# Patient Record
Sex: Male | Born: 1995 | Race: Black or African American | Hispanic: No | Marital: Single | Smoking: Never smoker
Health system: Southern US, Community
[De-identification: ages and names within clinical notes are randomized; demographics above are authoritative.]

## PROBLEM LIST (undated history)

## (undated) DIAGNOSIS — Z973 Presence of spectacles and contact lenses: Secondary | ICD-10-CM

## (undated) HISTORY — PX: ABCESS DRAINAGE: SHX399

## (undated) HISTORY — PX: WISDOM TOOTH EXTRACTION: SHX21

## (undated) HISTORY — PX: NASAL SEPTOPLASTY W/ TURBINOPLASTY: SHX2070

---
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
## 2016-01-26 ENCOUNTER — Other Ambulatory Visit: Payer: Self-pay | Admitting: Obstetrics and Gynecology

## 2016-11-06 ENCOUNTER — Encounter: Payer: Self-pay | Admitting: *Deleted

## 2016-11-08 NOTE — Discharge Instructions (Signed)
[facility] °[facility] °ENDOSCOPIC SINUS SURGERY °[facility] ° °What is Functional Endoscopic Sinus Surgery? ° The Surgery involves making the natural openings of the sinuses larger by removing the bony partitions that separate the sinuses from the nasal cavity.  The natural sinus lining is preserved as much as possible to allow the sinuses to resume normal function after the surgery.  In some patients nasal polyps (excessively swollen lining of the sinuses) may be removed to relieve obstruction of the sinus openings.  The surgery is performed through the nose using lighted scopes, which eliminates the need for incisions on the face.  A septoplasty is a different procedure which is sometimes performed with sinus surgery.  It involves straightening the boy partition that separates the two sides of your nose.  A crooked or deviated septum may need repair if is obstructing the sinuses or nasal airflow.  Turbinate reduction is also often performed during sinus surgery.  The turbinates are bony proturberances from the side walls of the nose which swell and can obstruct the nose in patients with sinus and allergy problems.  Their size can be surgically reduced to help relieve nasal obstruction. ° °What Can Sinus Surgery Do For Me? ° Sinus surgery can reduce the frequency of sinus infections requiring antibiotic treatment.  This can provide improvement in nasal congestion, post-nasal drainage, facial pressure and nasal obstruction.  Surgery will NOT prevent you from ever having an infection again, so it usually only for patients who get infections 4 or more times yearly requiring antibiotics, or for infections that do not clear with antibiotics.  It will not cure nasal allergies, so patients with allergies may still require medication to treat their allergies after surgery. Surgery may improve headaches related to sinusitis, however, some people will continue to  require medication to control sinus headaches related to allergies.  Surgery will do nothing for other forms of headache (migraine, tension or cluster). ° °What Are the Risks of Endoscopic Sinus Surgery? ° Current techniques allow surgery to be performed safely with little risk, however, there are rare complications that patients should be aware of.  Because the sinuses are located around the eyes, there is risk of eye injury, including blindness, though again, this would be quite rare. This is usually a result of bleeding behind the eye during surgery, which puts the vision oat risk, though there are treatments to protect the vision and prevent permanent disrupted by surgery causing a leak of the spinal fluid that surrounds the brain.  More serious complications would include bleeding inside the brain cavity or damage to the brain.  Again, all of these complications are uncommon, and spinal fluid leaks can be safely managed surgically if they occur.  The most common complication of sinus surgery is bleeding from the nose, which may require packing or cauterization of the nose.  Continued sinus have polyps may experience recurrence of the polyps requiring revision surgery.  Alterations of sense of smell or injury to the tear ducts are also rare complications.  ° °What is the Surgery Like, and what is the Recovery? ° The Surgery usually takes a couple of hours to perform, and is usually performed under a general anesthetic (completely asleep).  Patients are usually discharged home after a couple of hours.  Sometimes during surgery it is necessary to pack the nose to control bleeding, and the packing is left in place for 24 - 48 hours, and removed by your surgeon.    If a septoplasty was performed during the procedure, there is often a splint placed which must be removed after 5-7 days.   °Discomfort: Pain is usually mild to moderate, and can be controlled by prescription pain medication or acetaminophen (Tylenol).   Aspirin, Ibuprofen (Advil, Motrin), or Naprosyn (Aleve) should be avoided, as they can cause increased bleeding.  Most patients feel sinus pressure like they have a bad head cold for several days.  Sleeping with your head elevated can help reduce swelling and facial pressure, as can ice packs over the face.  A humidifier may be helpful to keep the mucous and blood from drying in the nose.  ° °Diet: There are no specific diet restrictions, however, you should generally start with clear liquids and a light diet of bland foods because the anesthetic can cause some nausea.  Advance your diet depending on how your stomach feels.  Taking your pain medication with food will often help reduce stomach upset which pain medications can cause. ° °Nasal Saline Irrigation: It is important to remove blood clots and dried mucous from the nose as it is healing.  This is done by having you irrigate the nose at least 3 - 4 times daily with a salt water solution.  We recommend using NeilMed Sinus Rinse (available at the drug store).  Fill the squeeze bottle with the solution, bend over a sink, and insert the tip of the squeeze bottle into the nose ½ of an inch.  Point the tip of the squeeze bottle towards the inside corner of the eye on the same side your irrigating.  Squeeze the bottle and gently irrigate the nose.  If you bend forward as you do this, most of the fluid will flow back out of the nose, instead of down your throat.   The solution should be warm, near body temperature, when you irrigate.   Each time you irrigate, you should use a full squeeze bottle.  ° °Note that if you are instructed to use Nasal Steroid Sprays at any time after your surgery, irrigate with saline BEFORE using the steroid spray, so you do not wash it all out of the nose. °Another product, Nasal Saline Gel (such as AYR Nasal Saline Gel) can be applied in each nostril 3 - 4 times daily to moisture the nose and reduce scabbing or crusting. ° °Bleeding:   Bloody drainage from the nose can be expected for several days, and patients are instructed to irrigate their nose frequently with salt water to help remove mucous and blood clots.  The drainage may be dark red or brown, though some fresh blood may be seen intermittently, especially after irrigation.  Do not blow you nose, as bleeding may occur. If you must sneeze, keep your mouth open to allow air to escape through your mouth. ° °If heavy bleeding occurs: Irrigate the nose with saline to rinse out clots, then spray the nose 3 - 4 times with Afrin Nasal Decongestant Spray.  The spray will constrict the blood vessels to slow bleeding.  Pinch the lower half of your nose shut to apply pressure, and lay down with your head elevated.  Ice packs over the nose may help as well. If bleeding persists despite these measures, you should notify your doctor.  Do not use the Afrin routinely to control nasal congestion after surgery, as it can result in worsening congestion and may affect healing.  ° °Activity: Return to work varies among patients. Most patients will be out of   work at least 5 - 7 days to recover.  Patient may return to work after they are off of narcotic pain medication, and feeling well enough to perform the functions of their job.  Patients must avoid heavy lifting (over 10 pounds) or strenuous physical for 2 weeks after surgery, so your employer may need to assign you to light duty, or keep you out of work longer if light duty is not possible.  NOTE: you should not drive, operate dangerous machinery, do any mentally demanding tasks or make any important legal or financial decisions while on narcotic pain medication and recovering from the general anesthetic.  °  °Call Your Doctor Immediately if You Have Any of the Following: °1. Bleeding that you cannot control with the above measures °2. Loss of vision, double vision, bulging of the eye or black eyes. °3. Fever over 101 degrees °4. Neck stiffness with severe  headache, fever, nausea and change in mental state. °You are always encourage to call anytime with concerns, however, please call with requests for pain medication refills during office hours. ° °Office Endoscopy: During follow-up visits your doctor will remove any packing or splints that may have been placed and evaluate and clean your sinuses endoscopically.  Topical anesthetic will be used to make this as comfortable as possible, though you may want to take your pain medication prior to the visit.  How often this will need to be done varies from patient to patient.  After complete recovery from the surgery, you may need follow-up endoscopy from time to time, particularly if there is concern of recurrent infection or nasal polyps. ° ° °General Anesthesia, Adult, Care After °These instructions provide you with information about caring for yourself after your procedure. Your health care provider may also give you more specific instructions. Your treatment has been planned according to current medical practices, but problems sometimes occur. Call your health care provider if you have any problems or questions after your procedure. °What can I expect after the procedure? °After the procedure, it is common to have: °· Vomiting. °· A sore throat. °· Mental slowness. °It is common to feel: °· Nauseous. °· Cold or shivery. °· Sleepy. °· Tired. °· Sore or achy, even in parts of your body where you did not have surgery. °Follow these instructions at home: °For at least 24 hours after the procedure: °· Do not: °¨ Participate in activities where you could fall or become injured. °¨ Drive. °¨ Use heavy machinery. °¨ Drink alcohol. °¨ Take sleeping pills or medicines that cause drowsiness. °¨ Make important decisions or sign legal documents. °¨ Take care of children on your own. °· Rest. °Eating and drinking °· If you vomit, drink water, juice, or soup when you can drink without vomiting. °· Drink enough fluid to keep your  urine clear or pale yellow. °· Make sure you have little or no nausea before eating solid foods. °· Follow the diet recommended by your health care provider. °General instructions °· Have a responsible adult stay with you until you are awake and alert. °· Return to your normal activities as told by your health care provider. Ask your health care provider what activities are safe for you. °· Take over-the-counter and prescription medicines only as told by your health care provider. °· If you smoke, do not smoke without supervision. °· Keep all follow-up visits as told by your health care provider. This is important. °Contact a health care provider if: °· You continue to have nausea   or vomiting at home, and medicines are not helpful. °· You cannot drink fluids or start eating again. °· You cannot urinate after 8-12 hours. °· You develop a skin rash. °· You have fever. °· You have increasing redness at the site of your procedure. °Get help right away if: °· You have difficulty breathing. °· You have chest pain. °· You have unexpected bleeding. °· You feel that you are having a life-threatening or urgent problem. °This information is not intended to replace advice given to you by your health care provider. Make sure you discuss any questions you have with your health care provider. °Document Released: 02/12/2001 Document Revised: 04/10/2016 Document Reviewed: 10/21/2015 °Elsevier Interactive Patient Education © 2017 [employer] ° °

## 2016-11-10 ENCOUNTER — Ambulatory Visit: Payer: Medicaid Other | Admitting: Anesthesiology

## 2016-11-10 ENCOUNTER — Encounter
Admission: RE | Disposition: A | Discharge status: Home / Self Care | Payer: Self-pay | Source: Ambulatory Visit | Attending: Unknown Physician Specialty

## 2016-11-10 ENCOUNTER — Ambulatory Visit
Admission: RE | Admit: 2016-11-10 | Discharge: 2016-11-10 | Disposition: A | Discharge status: Home / Self Care | Payer: Medicaid Other | Source: Ambulatory Visit | Attending: Unknown Physician Specialty | Admitting: Unknown Physician Specialty

## 2016-11-10 DIAGNOSIS — J3489 Other specified disorders of nose and nasal sinuses: Secondary | ICD-10-CM | POA: Diagnosis not present

## 2016-11-10 HISTORY — PX: NASAL TURBINATE REDUCTION: SHX2072

## 2016-11-10 HISTORY — DX: Presence of spectacles and contact lenses: Z97.3

## 2016-11-10 HISTORY — PX: SEPTOPLASTY: SHX2393

## 2016-11-10 SURGERY — SEPTOPLASTY, NOSE
Anesthesia: General | Wound class: Clean Contaminated

## 2016-11-10 MED ORDER — ROCURONIUM BROMIDE 100 MG/10ML IV SOLN
INTRAVENOUS | Status: DC | PRN
  Administered 2016-11-10: 20 mg via INTRAVENOUS

## 2016-11-10 MED ORDER — GLYCOPYRROLATE 0.2 MG/ML IJ SOLN
INTRAMUSCULAR | Status: DC | PRN
  Administered 2016-11-10: 0.1 mg via INTRAVENOUS

## 2016-11-10 MED ORDER — MIDAZOLAM HCL 5 MG/5ML IJ SOLN
INTRAMUSCULAR | Status: DC | PRN
  Administered 2016-11-10: 2 mg via INTRAVENOUS

## 2016-11-10 MED ORDER — OXYMETAZOLINE HCL 0.05 % NA SOLN
6.0000 | Freq: Once | NASAL | Status: AC
  Administered 2016-11-10: 6 via NASAL

## 2016-11-10 MED ORDER — PROPOFOL 10 MG/ML IV BOLUS
INTRAVENOUS | Status: DC | PRN
  Administered 2016-11-10: 160 mg via INTRAVENOUS
  Administered 2016-11-10: 40 mg via INTRAVENOUS

## 2016-11-10 MED ORDER — ACETAMINOPHEN 10 MG/ML IV SOLN
1000.0000 mg | Freq: Once | INTRAVENOUS | Status: AC
  Administered 2016-11-10: 1000 mg via INTRAVENOUS

## 2016-11-10 MED ORDER — DEXAMETHASONE SODIUM PHOSPHATE 4 MG/ML IJ SOLN
INTRAMUSCULAR | Status: DC | PRN
  Administered 2016-11-10: 10 mg via INTRAVENOUS

## 2016-11-10 MED ORDER — FENTANYL CITRATE (PF) 100 MCG/2ML IJ SOLN
INTRAMUSCULAR | Status: DC | PRN
  Administered 2016-11-10: 100 ug via INTRAVENOUS

## 2016-11-10 MED ORDER — SCOPOLAMINE 1 MG/3DAYS TD PT72
1.0000 | MEDICATED_PATCH | TRANSDERMAL | Status: DC
  Administered 2016-11-10: 1.5 mg via TRANSDERMAL

## 2016-11-10 MED ORDER — SULFAMETHOXAZOLE-TRIMETHOPRIM 800-160 MG PO TABS: 1.0000 | ORAL_TABLET | Freq: Two times a day (BID) | ORAL | 0 refills | Status: DC

## 2016-11-10 MED ORDER — OXYCODONE HCL 5 MG/5ML PO SOLN: 5.0000 mg | Freq: Once | ORAL | Status: AC | PRN

## 2016-11-10 MED ORDER — OXYCODONE-ACETAMINOPHEN 5-325 MG PO TABS: 1.0000 | ORAL_TABLET | ORAL | 0 refills | Status: DC | PRN

## 2016-11-10 MED ORDER — LIDOCAINE-EPINEPHRINE 2 %-1:100000 IJ SOLN
INTRAMUSCULAR | Status: DC | PRN
  Administered 2016-11-10: 12 mL via INTRADERMAL

## 2016-11-10 MED ORDER — ONDANSETRON HCL 4 MG/2ML IJ SOLN
INTRAMUSCULAR | Status: DC | PRN
  Administered 2016-11-10: 4 mg via INTRAVENOUS

## 2016-11-10 MED ORDER — OXYCODONE HCL 5 MG PO TABS
5.0000 mg | ORAL_TABLET | Freq: Once | ORAL | Status: AC | PRN
  Administered 2016-11-10: 5 mg via ORAL

## 2016-11-10 MED ORDER — LIDOCAINE HCL (CARDIAC) 20 MG/ML IV SOLN
INTRAVENOUS | Status: DC | PRN
  Administered 2016-11-10: 40 mg via INTRAVENOUS

## 2016-11-10 MED ORDER — LACTATED RINGERS IV SOLN
INTRAVENOUS | Status: DC
  Administered 2016-11-10 (×2): via INTRAVENOUS

## 2016-11-10 MED ORDER — PHENYLEPHRINE HCL 0.5 % NA SOLN
NASAL | Status: DC | PRN
  Administered 2016-11-10: 15 mL via TOPICAL

## 2016-11-10 SURGICAL SUPPLY — 31 items
BLADE SURG 15 STRL LF DISP TIS (BLADE) IMPLANT
BLADE SURG 15 STRL SS (BLADE)
COAG SUCT 10F 3.5MM HAND CTRL (MISCELLANEOUS) ×2 IMPLANT
DRAPE HEAD BAR (DRAPES) ×2 IMPLANT
DRESSING NASL FOAM PST OP SINU (MISCELLANEOUS) IMPLANT
DRSG NASAL FOAM POST OP SINU (MISCELLANEOUS)
GLOVE BIO SURGEON STRL SZ7.5 (GLOVE) ×4 IMPLANT
HANDLE YANKAUER SUCT BULB TIP (MISCELLANEOUS) ×2 IMPLANT
KIT ROOM TURNOVER OR (KITS) ×2 IMPLANT
NEEDLE HYPO 25GX1X1/2 BEV (NEEDLE) ×2 IMPLANT
NS IRRIG 500ML POUR BTL (IV SOLUTION) ×2 IMPLANT
PACK DRAPE NASAL/ENT (PACKS) ×2 IMPLANT
PAD GROUND ADULT SPLIT (MISCELLANEOUS) ×2 IMPLANT
SOL ANTI-FOG 6CC FOG-OUT (MISCELLANEOUS) ×1 IMPLANT
SOL FOG-OUT ANTI-FOG 6CC (MISCELLANEOUS) ×1
SPLINT NASAL SEPTAL BLV .25 LG (MISCELLANEOUS) IMPLANT
SPLINT NASAL SEPTAL BLV .50 ST (MISCELLANEOUS) ×2 IMPLANT
SPONGE NEURO XRAY DETECT 1X3 (DISPOSABLE) ×2 IMPLANT
STRAP BODY AND KNEE 60X3 (MISCELLANEOUS) ×2 IMPLANT
SUT CHROMIC 3-0 (SUTURE) ×1
SUT CHROMIC 3-0 KS 27XMFL CR (SUTURE) ×1
SUT CHROMIC 5-0 (SUTURE)
SUT CHROMIC 5-0 P2 18XMFL CR (SUTURE)
SUT ETHILON 3-0 KS 30 BLK (SUTURE) ×2 IMPLANT
SUT PLAIN GUT 4-0 (SUTURE) IMPLANT
SUTURE CHRMC 3-0 KS 27XMFL CR (SUTURE) ×1 IMPLANT
SUTURE CHRMC 5-0 P2 18XMF CR (SUTURE) IMPLANT
SYRINGE 10CC LL (SYRINGE) ×2 IMPLANT
TOWEL OR 17X26 4PK STRL BLUE (TOWEL DISPOSABLE) ×2 IMPLANT
WATER STERILE IRR 250ML POUR (IV SOLUTION) ×2 IMPLANT
WATER STERILE IRR 500ML POUR (IV SOLUTION) ×2 IMPLANT

## 2016-11-10 NOTE — Transfer of Care (Signed)
Immediate Anesthesia Transfer of Care Note  Patient: Vincent Edwards  Procedure(s) Performed: Procedure(s): SEPTOPLASTY (N/A) TURBINATE REDUCTION/SUBMUCOSAL RESECTION (N/A)  Patient Location: PACU  Anesthesia Type: General  Level of Consciousness: awake, alert  and patient cooperative  Airway and Oxygen Therapy: Patient Spontanous Breathing and Patient connected to supplemental oxygen  Post-op Assessment: Post-op Vital signs reviewed, Patient's Cardiovascular Status Stable, Respiratory Function Stable, Patent Airway and No signs of Nausea or vomiting  Post-op Vital Signs: Reviewed and stable  Complications: No apparent anesthesia complications

## 2016-11-10 NOTE — Anesthesia Postprocedure Evaluation (Signed)
Anesthesia Post Note  Patient: Vincent Edwards  Procedure(s) Performed: Procedure(s) (LRB): SEPTOPLASTY (N/A) TURBINATE REDUCTION/SUBMUCOSAL RESECTION (N/A)  Patient location during evaluation: PACU Anesthesia Type: General Level of consciousness: awake and alert Pain management: pain level controlled Vital Signs Assessment: post-procedure vital signs reviewed and stable Respiratory status: spontaneous breathing, nonlabored ventilation, respiratory function stable and patient connected to nasal cannula oxygen Cardiovascular status: blood pressure returned to baseline and stable Postop Assessment: no signs of nausea or vomiting Anesthetic complications: no    Adlyn Fife

## 2016-11-10 NOTE — H&P (Signed)
  H+P  Reviewed and will be scanned in later. No changes noted. 

## 2016-11-10 NOTE — Anesthesia Preprocedure Evaluation (Addendum)
Anesthesia Evaluation  Patient identified by MRN, date of birth, ID band  Reviewed: NPO status   History of Anesthesia Complications Negative for: history of anesthetic complications  Airway Mallampati: II  TM Distance: >3 FB Neck ROM: full    Dental no notable dental hx.    Pulmonary neg pulmonary ROS,    Pulmonary exam normal        Cardiovascular Exercise Tolerance: Good negative cardio ROS Normal cardiovascular exam     Neuro/Psych negative neurological ROS  negative psych ROS   GI/Hepatic negative GI ROS, Neg liver ROS,   Endo/Other  negative endocrine ROS  Renal/GU negative Renal ROS  negative genitourinary   Musculoskeletal Fractured pelvis age 20   Abdominal   Peds  Hematology negative hematology ROS (+)   Anesthesia Other Findings   Reproductive/Obstetrics                            Anesthesia Physical Anesthesia Plan  ASA: I  Anesthesia Plan: General   Post-op Pain Management:    Induction:   Airway Management Planned:   Additional Equipment:   Intra-op Plan:   Post-operative Plan:   Informed Consent: I have reviewed the patients History and Physical, chart, labs and discussed the procedure including the risks, benefits and alternatives for the proposed anesthesia with the patient or authorized representative who has indicated his/her understanding and acceptance.     Plan Discussed with: CRNA  Anesthesia Plan Comments:         Anesthesia Quick Evaluation

## 2016-11-10 NOTE — Op Note (Signed)
PREOPERATIVE DIAGNOSIS:  Chronic nasal obstruction.  POSTOPERATIVE DIAGNOSIS:  Chronic nasal obstruction.  SURGEON:  Davina Pokehapman T. Jacynda Brunke, M.D.  NAME OF PROCEDURE:  1. Nasal septoplasty. 2. Submucous resection of inferior turbinates.  OPERATIVE FINDINGS:  Severe nasal septal deformity, hypertrophy of the inferior turbinates.   DESCRIPTION OF THE PROCEDURE:  Vincent Edwards was identified in the holding area and taken to the operating room and placed in the supine position.  After general endotracheal anesthesia was induced, the table was turned 45 degrees and the patient was placed in a semi-Fowler position.  The nose was then topically anesthetized with Lidocaine, cotton pledgets were placed within each nostril. After approximately 5 minutes, this was removed at which time a local anesthetic of 1% Lidocaine 1:100,000 units of Epinephrine was used to inject the inferior turbinates in the nasal septum. A total of 12 ml was used. Examination of the nose showed a severe left nasal septal deformity and tremendous hypertrophied inferior turbinate.  Beginning on the right hand side a hemitransfixion incision was then created on the leading edge of the septum on the right.  A subperichondrial plane was elevated posteriorly on the left and taken back to the perpendicular plate of the ethmoid where subperiosteal plane was elevated posteriorly on the left. A large septal spur was identified on the left hand side impacting on the inferior turbinate.  An inferior rim of cartilage was removed anteriorly with care taken to leave an anterior strut to prevent nasal collapse. With this strut removed the perpendicular plate of the ethmoid was separated from the quadrangular cartilage. The large septal spur was removed.  The septum was then replaced in the midline. Reinspection through each nostril showed excellent reduction of the septal deformity. A left posterior inferior fenestration was then created to allow hematoma  drainage.  With the septoplasty completed, beginning on the left-hand side, a 15 blade was used to incise along the inferior edge of the inferior turbinate. A superior laterally based flap was then elevated. The underlying conchal bone of mucosa was excised using Knight scissors. The flap was then laid back over the turbinate stump and cauterized using suction cautery. In a similar fashion the submucous resection was performed on the right.  With the submucous resection completed bilaterally and no active bleeding, the hemitransfixion incision was then closed using two interrupted 3-0 chromic sutures.  Plastic nasal septal splints were placed within each nostril and affixed to the septum using a 3-0 nylon suture. Stammberger was then used beneath each inferior turbinate for hemostasis.    The patient tolerated the procedure well, was returned to anesthesia, extubated in the operating room, and taken to the recovery room in stable condition.    CULTURES:  None.  SPECIMENS:  None.  ESTIMATED BLOOD LOSS:  25 cc.  Aubriauna Riner T  11/10/2016  10:33 AM

## 2016-11-10 NOTE — Anesthesia Procedure Notes (Signed)
Procedure Name: Intubation Date/Time: 11/10/2016 9:52 AM Performed by: Jimmy PicketAMYOT, Rielly Corlett Pre-anesthesia Checklist: Patient identified, Emergency Drugs available, Suction available, Patient being monitored and Timeout performed Patient Re-evaluated:Patient Re-evaluated prior to inductionOxygen Delivery Method: Circle system utilized Preoxygenation: Pre-oxygenation with 100% oxygen Intubation Type: IV induction Ventilation: Mask ventilation without difficulty Laryngoscope Size: Miller and 3 Grade View: Grade I Tube type: Oral Rae Tube size: 7.5 mm Number of attempts: 1 Placement Confirmation: ETT inserted through vocal cords under direct vision,  positive ETCO2 and breath sounds checked- equal and bilateral Tube secured with: Tape Dental Injury: Teeth and Oropharynx as per pre-operative assessment

## 2016-11-11 ENCOUNTER — Encounter: Payer: Self-pay | Admitting: Family Medicine

## 2016-11-14 ENCOUNTER — Encounter: Payer: Self-pay | Admitting: Unknown Physician Specialty

## 2019-01-22 ENCOUNTER — Ambulatory Visit: Payer: Self-pay | Admitting: Family Medicine

## 2019-10-05 ENCOUNTER — Emergency Department (HOSPITAL_COMMUNITY)
Admission: EM | Admit: 2019-10-05 | Discharge: 2019-10-06 | Disposition: A | Discharge status: Home / Self Care | Payer: Managed Care, Other (non HMO) | Attending: Emergency Medicine | Admitting: Emergency Medicine

## 2019-10-05 ENCOUNTER — Other Ambulatory Visit: Payer: Self-pay

## 2019-10-05 ENCOUNTER — Encounter (HOSPITAL_COMMUNITY): Payer: Self-pay | Admitting: Emergency Medicine

## 2019-10-05 DIAGNOSIS — K859 Acute pancreatitis without necrosis or infection, unspecified: Secondary | ICD-10-CM | POA: Diagnosis not present

## 2019-10-05 DIAGNOSIS — Z20828 Contact with and (suspected) exposure to other viral communicable diseases: Secondary | ICD-10-CM | POA: Diagnosis not present

## 2019-10-05 DIAGNOSIS — Z20822 Contact with and (suspected) exposure to covid-19: Secondary | ICD-10-CM

## 2019-10-05 DIAGNOSIS — R1013 Epigastric pain: Secondary | ICD-10-CM | POA: Diagnosis present

## 2019-10-05 LAB — COMPREHENSIVE METABOLIC PANEL
ALT: 24 U/L (ref 0–44)
AST: 21 U/L (ref 15–41)
Albumin: 4.5 g/dL (ref 3.5–5.0)
Alkaline Phosphatase: 48 U/L (ref 38–126)
Anion gap: 11 (ref 5–15)
BUN: 21 mg/dL — ABNORMAL HIGH (ref 6–20)
CO2: 24 mmol/L (ref 22–32)
Calcium: 9.5 mg/dL (ref 8.9–10.3)
Chloride: 104 mmol/L (ref 98–111)
Creatinine, Ser: 1.17 mg/dL (ref 0.61–1.24)
GFR calc Af Amer: >60 mL/min (ref >60)
GFR calc non Af Amer: >60 mL/min (ref >60)
Glucose, Bld: 117 mg/dL — ABNORMAL HIGH (ref 70–99)
Potassium: 4.4 mmol/L (ref 3.5–5.1)
Sodium: 139 mmol/L (ref 135–145)
Total Bilirubin: 1 mg/dL (ref 0.3–1.2)
Total Protein: 8 g/dL (ref 6.5–8.1)

## 2019-10-05 LAB — URINALYSIS, ROUTINE W REFLEX MICROSCOPIC
Bilirubin Urine: NEGATIVE
Glucose, UA: NEGATIVE mg/dL
Hgb urine dipstick: NEGATIVE
Ketones, ur: NEGATIVE mg/dL
Leukocytes,Ua: NEGATIVE
Nitrite: NEGATIVE
Protein, ur: NEGATIVE mg/dL
Specific Gravity, Urine: 1.03 (ref 1.005–1.030)
pH: 6 (ref 5.0–8.0)

## 2019-10-05 LAB — CBC
HCT: 48.5 % (ref 39.0–52.0)
Hemoglobin: 15.9 g/dL (ref 13.0–17.0)
MCH: 30.8 pg (ref 26.0–34.0)
MCHC: 32.8 g/dL (ref 30.0–36.0)
MCV: 94 fL (ref 80.0–100.0)
Platelets: 186 10*3/uL (ref 150–400)
RBC: 5.16 MIL/uL (ref 4.22–5.81)
RDW: 11.9 % (ref 11.5–15.5)
WBC: 4.6 10*3/uL (ref 4.0–10.5)
nRBC: 0 % (ref 0.0–0.2)

## 2019-10-05 LAB — LIPASE, BLOOD: Lipase: 135 U/L — ABNORMAL HIGH (ref 11–51)

## 2019-10-05 MED ORDER — SODIUM CHLORIDE 0.9% FLUSH: 3.0000 mL | Freq: Once | INTRAVENOUS | Status: DC

## 2019-10-05 NOTE — ED Triage Notes (Signed)
Patient states that there was a Covid 19 exposure at work two days ago.  He now is having abdominal pain.  He denies any vomiting, no fever, no shortness of breath, no chest pain, no loss of taste or smell, no sore throat.  Patient does have diarrhea with his pain.

## 2019-10-05 NOTE — ED Provider Notes (Signed)
[facility] EMERGENCY DEPARTMENT Provider Note   CSN: [id number] Arrival date & time: 10/05/19  1902     History   Chief Complaint Chief Complaint  Patient presents with  . Abdominal Pain    HPI Vincent Edwards is a 23 y.o. male with no significant past medical history who presents today for evaluation of abdominal pain.  He reports that for approximately 1 week he has had mid and upper abdominal pain in the middle of his upper abdomen.  He reports that it is made worse with eating thick bread.  He states that it feels like it shoots across his upper abdomen.   He reports that when this first started he thought he may be constipated so he took a dose of magnesium citrate and has had multiple episodes of diarrhea since.  He reports that he occasionally gets nauseous however denies any vomiting.  He does report that he possibly had a COVID-19 exposure at work 2 days ago however his symptoms had all started prior to his exposure.  He denies any shortness of breath, cough, chest pain.  No loss of sense of taste or smell.  No fevers or sore throat.  He denies any alcohol use.     HPI  Past Medical History:  Diagnosis Date  . Wears contact lenses     Patient Active Problem List   Diagnosis Date Noted  . Acromioclavicular sprain 09/25/2014  . Enthesopathy of hip 01/07/2013  . Disorder of pelvis 12/03/2012  . Closed avulsion fracture of anterior superior iliac spine of pelvis ([place]) 12/03/2012  . Arthralgia of hip 12/03/2012  . CONCUSSION 09/02/2010  . PES PLANUS 08/17/2010    Past Surgical History:  Procedure Laterality Date  . ABCESS DRAINAGE     nasal  . HIP SURGERY Bilateral 2014   Football injury  . NASAL SEPTOPLASTY W/ TURBINOPLASTY  12/207   chronic congestion - [place]  . NASAL TURBINATE REDUCTION N/A 11/10/2016   Procedure: TURBINATE REDUCTION/SUBMUCOSAL RESECTION;  Surgeon: Beverly Gust, MD;  Location: [facility];  Service: ENT;   Laterality: N/A;  . SEPTOPLASTY N/A 11/10/2016   Procedure: SEPTOPLASTY;  Surgeon: Beverly Gust, MD;  Location: [facility];  Service: ENT;  Laterality: N/A;  . WISDOM TOOTH EXTRACTION          Home Medications    Prior to Admission medications   Medication Sig Start Date End Date Taking? Authorizing Provider  oxyCODONE-acetaminophen (ROXICET) 5-325 MG tablet Take 1-2 tablets by mouth every 4 (four) hours as needed for severe pain. Patient not taking: Reported on 10/06/2019 11/10/16   Beverly Gust, MD  sulfamethoxazole-trimethoprim (BACTRIM DS,SEPTRA DS) 800-160 MG tablet Take 1 tablet by mouth 2 (two) times daily. Patient not taking: Reported on 10/06/2019 11/10/16   Beverly Gust, MD    Family History Family History  Problem Relation Age of Onset  . Diabetes Maternal Grandmother     Social History Social History   Tobacco Use  . Smoking status: Never Smoker  . Smokeless tobacco: Never Used  Substance Use Topics  . Alcohol use: No    Alcohol/week: 0.0 standard drinks  . Drug use: No     Allergies   Patient has no known allergies.   Review of Systems Review of Systems  Constitutional: Negative for chills and fever.  HENT: Negative for congestion.   Eyes: Negative for visual disturbance.  Respiratory: Negative for cough and shortness of breath.   Cardiovascular: Negative for chest pain.  Gastrointestinal:  Positive for abdominal pain, diarrhea and nausea. Negative for vomiting.  Genitourinary: Negative for dysuria and urgency.  Musculoskeletal: Negative for back pain and neck pain.  Skin: Negative for color change, rash and wound.  Neurological: Negative for weakness and headaches.  Psychiatric/Behavioral: Negative for confusion.  All other systems reviewed and are negative.    Physical Exam Updated Vital Signs BP 130/78 (BP Location: Right Arm)   Pulse (!) 58   Temp 97.9 F (36.6 C) (Oral)   Resp 18   SpO2 97%   Physical Exam  Vitals signs and nursing note reviewed.  Constitutional:      General: He is not in acute distress.    Appearance: He is well-developed. He is not diaphoretic.  HENT:     Head: Normocephalic and atraumatic.  Eyes:     General: No scleral icterus.       Right eye: No discharge.        Left eye: No discharge.     Conjunctiva/sclera: Conjunctivae normal.  Neck:     Musculoskeletal: Normal range of motion.  Cardiovascular:     Rate and Rhythm: Regular rhythm. Bradycardia present.     Heart sounds: Normal heart sounds.  Pulmonary:     Effort: Pulmonary effort is normal. No respiratory distress.     Breath sounds: No stridor.  Abdominal:     General: Abdomen is flat. Bowel sounds are increased. There is no distension.     Palpations: Abdomen is soft.     Tenderness: There is abdominal tenderness in the right upper quadrant and epigastric area.     Hernia: No hernia is present.  Musculoskeletal:        General: No deformity.  Skin:    General: Skin is warm and dry.  Neurological:     Mental Status: He is alert.     Motor: No abnormal muscle tone.     Comments: Able to provide history with out difficulty.   Psychiatric:        Mood and Affect: Mood normal.        Behavior: Behavior normal.      ED Treatments / Results  Labs (all labs ordered are listed, but only abnormal results are displayed) Labs Reviewed  LIPASE, BLOOD - Abnormal; Notable for the following components:      Result Value   Lipase 135 (*)    All other components within normal limits  COMPREHENSIVE METABOLIC PANEL - Abnormal; Notable for the following components:   Glucose, Bld 117 (*)    BUN 21 (*)    All other components within normal limits  SARS CORONAVIRUS 2 (TAT 6-24 HRS)  CBC  URINALYSIS, ROUTINE W REFLEX MICROSCOPIC    EKG None  Radiology No results found.  Procedures Procedures (including critical care time)  Medications Ordered in ED Medications  sodium chloride flush (NS) 0.9 %  injection 3 mL (has no administration in time range)     Initial Impression / Assessment and Plan / ED Course  I have reviewed the triage vital signs and the nursing notes.  Pertinent labs & imaging results that were available during my care of the patient were reviewed by me and considered in my medical decision making (see chart for details).       Patient presents today for evaluation of upper abdominal pain for approximately 1 week ago.  On exam he has mild tenderness to palpation in the epigastric and right upper quadrant.  He does note diarrhea, however  I suspect this is secondary to the magnesium citrate that he took.  He reports mild nausea without vomiting.  On exam he is generally well-appearing, afebrile and not tachycardic.  He appears slightly hemoconcentrated as his creatinine is at the upper end of normal at 1.17, recommended increase p.o. fluids.  CBC and CMP otherwise without significant hematologic or electrolyte derangements.  UA does show specific gravity 1.030 consistent with mild dehydration.  Lipase is elevated at 135.  He denies any alcohol use.  He had a CT scan in 2012 which did not note any significant congenital abnormalities.   Given that his symptoms have been going on for over a week if he had a significant obstructive pathology I would expect transaminitis or an elevated alk phos however these are normal.  His pain is mild and intermittent.  He was able to p.o. challenge in the emergency room without difficulty.  We discussed clear liquid diet for 3 days followed by pancreas diet and the importance of abstaining from alcohol.  We discussed specific return precautions.  He was offered prescriptions for both nausea medicine and pain medicine both of which he refused.  Based on the apparent mild nature of his symptoms, given his refusal of nausea and pain medicine, do not suspect imaging would change anticipated treatment and disposition.  He did have a Covid exposure at  work and reports that he needs a Covid test before he can return to work.  His diarrhea, nausea and other symptoms were present before the exposure therefore I do not suspect they are related, however Covid test was ordered.  He denies any cough, chest pain or shortness of breath.    This patient was discussed with Dr. Roxanne Mins.   Return precautions were discussed with patient who states their understanding.  At the time of discharge patient denied any unaddressed complaints or concerns.  Patient is agreeable for discharge home.   Final Clinical Impressions(s) / ED Diagnoses   Final diagnoses:  Acute pancreatitis, unspecified complication status, unspecified pancreatitis type  Exposure to COVID-19 virus    ED Discharge Orders    None       Ollen Gross 73/71/06 2694    Delora Fuel, MD 85/46/27 [phone]

## 2019-10-06 LAB — SARS CORONAVIRUS 2 (TAT 6-24 HRS): SARS Coronavirus 2: NEGATIVE

## 2019-10-06 NOTE — Discharge Instructions (Addendum)
Please follow a clear liquid diet for the next 3 days.  After that you may slowly start to resume solid foods.  Please follow the listed pancreatitis eating plan.  Please make sure you are drinking plenty of fluids.  Please follow up with primary care doctor.

## 2019-10-14 ENCOUNTER — Other Ambulatory Visit: Payer: Self-pay

## 2019-10-14 ENCOUNTER — Encounter (HOSPITAL_COMMUNITY): Payer: Self-pay | Admitting: Emergency Medicine

## 2019-10-14 ENCOUNTER — Emergency Department (HOSPITAL_COMMUNITY)
Admission: EM | Admit: 2019-10-14 | Discharge: 2019-10-14 | Disposition: A | Discharge status: Home / Self Care | Payer: Managed Care, Other (non HMO) | Attending: Emergency Medicine | Admitting: Emergency Medicine

## 2019-10-14 ENCOUNTER — Emergency Department (HOSPITAL_COMMUNITY): Payer: Managed Care, Other (non HMO)

## 2019-10-14 DIAGNOSIS — R110 Nausea: Secondary | ICD-10-CM

## 2019-10-14 DIAGNOSIS — R1084 Generalized abdominal pain: Secondary | ICD-10-CM | POA: Insufficient documentation

## 2019-10-14 DIAGNOSIS — R1031 Right lower quadrant pain: Secondary | ICD-10-CM | POA: Diagnosis present

## 2019-10-14 LAB — COMPREHENSIVE METABOLIC PANEL
ALT: 29 U/L (ref 0–44)
AST: 28 U/L (ref 15–41)
Albumin: 4.5 g/dL (ref 3.5–5.0)
Alkaline Phosphatase: 48 U/L (ref 38–126)
Anion gap: 9 (ref 5–15)
BUN: 22 mg/dL — ABNORMAL HIGH (ref 6–20)
CO2: 28 mmol/L (ref 22–32)
Calcium: 9.5 mg/dL (ref 8.9–10.3)
Chloride: 102 mmol/L (ref 98–111)
Creatinine, Ser: 1.52 mg/dL — ABNORMAL HIGH (ref 0.61–1.24)
GFR calc Af Amer: >60 mL/min (ref >60)
GFR calc non Af Amer: >60 mL/min (ref >60)
Glucose, Bld: 119 mg/dL — ABNORMAL HIGH (ref 70–99)
Potassium: 4.8 mmol/L (ref 3.5–5.1)
Sodium: 139 mmol/L (ref 135–145)
Total Bilirubin: 1.1 mg/dL (ref 0.3–1.2)
Total Protein: 7.5 g/dL (ref 6.5–8.1)

## 2019-10-14 LAB — URINALYSIS, ROUTINE W REFLEX MICROSCOPIC
Bilirubin Urine: NEGATIVE
Glucose, UA: NEGATIVE mg/dL
Hgb urine dipstick: NEGATIVE
Ketones, ur: NEGATIVE mg/dL
Leukocytes,Ua: NEGATIVE
Nitrite: NEGATIVE
Protein, ur: NEGATIVE mg/dL
Specific Gravity, Urine: 1.028 (ref 1.005–1.030)
pH: 6 (ref 5.0–8.0)

## 2019-10-14 LAB — CBC
HCT: 44.4 % (ref 39.0–52.0)
Hemoglobin: 15.2 g/dL (ref 13.0–17.0)
MCH: 30.9 pg (ref 26.0–34.0)
MCHC: 34.2 g/dL (ref 30.0–36.0)
MCV: 90.2 fL (ref 80.0–100.0)
Platelets: 202 10*3/uL (ref 150–400)
RBC: 4.92 MIL/uL (ref 4.22–5.81)
RDW: 11.7 % (ref 11.5–15.5)
WBC: 6 10*3/uL (ref 4.0–10.5)
nRBC: 0 % (ref 0.0–0.2)

## 2019-10-14 LAB — LIPASE, BLOOD: Lipase: 26 U/L (ref 11–51)

## 2019-10-14 MED ORDER — IOHEXOL 300 MG/ML  SOLN
100.0000 mL | Freq: Once | INTRAMUSCULAR | Status: AC | PRN
  Administered 2019-10-14: 100 mL via INTRAVENOUS

## 2019-10-14 MED ORDER — ONDANSETRON 4 MG PO TBDP: 4.0000 mg | ORAL_TABLET | Freq: Three times a day (TID) | ORAL | 0 refills | Status: DC | PRN

## 2019-10-14 MED ORDER — FAMOTIDINE 20 MG PO TABS: 20.0000 mg | ORAL_TABLET | Freq: Two times a day (BID) | ORAL | 0 refills | Status: DC

## 2019-10-14 MED ORDER — ONDANSETRON HCL 4 MG/2ML IJ SOLN
4.0000 mg | Freq: Once | INTRAMUSCULAR | Status: AC
Start: 2019-10-14 — End: 2019-10-14
  Administered 2019-10-14: 4 mg via INTRAVENOUS
  Filled 2019-10-14: qty 2

## 2019-10-14 MED ORDER — FAMOTIDINE 20 MG PO TABS
40.0000 mg | ORAL_TABLET | Freq: Once | ORAL | Status: AC
  Administered 2019-10-14: 40 mg via ORAL
  Filled 2019-10-14: qty 2

## 2019-10-14 MED ORDER — LACTATED RINGERS IV BOLUS
1000.0000 mL | Freq: Once | INTRAVENOUS | Status: AC
  Administered 2019-10-14: 09:00:00 1000 mL via INTRAVENOUS

## 2019-10-14 MED ORDER — SODIUM CHLORIDE 0.9% FLUSH
3.0000 mL | Freq: Once | INTRAVENOUS | Status: AC
  Administered 2019-10-14: 3 mL via INTRAVENOUS

## 2019-10-14 NOTE — ED Notes (Signed)
Pt ambulated to RR and back to room. Pt ambulated well

## 2019-10-14 NOTE — ED Provider Notes (Signed)
[facility] EMERGENCY DEPARTMENT Provider Note   CSN: [id number] Arrival date & time: 10/14/19  0327     History   Chief Complaint Chief Complaint  Patient presents with   Abdominal Pain    HPI Vincent Edwards is a 23 y.o. male.     23yo M who p/w abd pain and nausea. Pt presented to ED on 11/16 for abd pain and nausea.  At that time, his lipase was mildly elevated and he was diagnosed with pancreatitis, instructed to follow clear liquid diet until symptoms improved.  He reports that he has been following instructions but his abdominal pain has persisted and his nausea has worsened.  Occasionally his pain is in in the right lower quadrant and becomes severe, radiating to his back, and then suddenly releases and he feels better momentarily.  He denies any associated urinary symptoms.  He has not had any vomiting or diarrhea.  No fevers or URI symptoms.  He denies any alcohol use.  No heavy NSAID or Tylenol use.  His pain is worse when he sneezes.  The history is provided by the patient.  Abdominal Pain   Past Medical History:  Diagnosis Date   Wears contact lenses     Patient Active Problem List   Diagnosis Date Noted   Acromioclavicular sprain 09/25/2014   Enthesopathy of hip 01/07/2013   Disorder of pelvis 12/03/2012   Closed avulsion fracture of anterior superior iliac spine of pelvis ([place]) 12/03/2012   Arthralgia of hip 12/03/2012   CONCUSSION 09/02/2010   PES PLANUS 08/17/2010    Past Surgical History:  Procedure Laterality Date   ABCESS DRAINAGE     nasal   HIP SURGERY Bilateral 2014   Football injury   NASAL SEPTOPLASTY W/ TURBINOPLASTY  12/207   chronic congestion - [place]   NASAL TURBINATE REDUCTION N/A 11/10/2016   Procedure: TURBINATE REDUCTION/SUBMUCOSAL RESECTION;  Surgeon: Beverly Gust, MD;  Location: [facility];  Service: ENT;  Laterality: N/A;   SEPTOPLASTY N/A 11/10/2016   Procedure: Valarie Merino;  Surgeon:  Beverly Gust, MD;  Location: [facility];  Service: ENT;  Laterality: N/A;   [place] Medications    Prior to Admission medications   Medication Sig Start Date End Date Taking? Authorizing Provider  famotidine (PEPCID) 20 MG tablet Take 1 tablet (20 mg total) by mouth 2 (two) times daily. 10/14/19   Ariyannah Pauling, Wenda Overland, MD  ondansetron (ZOFRAN ODT) 4 MG disintegrating tablet Take 1 tablet (4 mg total) by mouth every 8 (eight) hours as needed for nausea or vomiting. 10/14/19   Toshiro Hanken, Wenda Overland, MD  oxyCODONE-acetaminophen (ROXICET) 5-325 MG tablet Take 1-2 tablets by mouth every 4 (four) hours as needed for severe pain. Patient not taking: Reported on 10/06/2019 11/10/16   Beverly Gust, MD  sulfamethoxazole-trimethoprim (BACTRIM DS,SEPTRA DS) 800-160 MG tablet Take 1 tablet by mouth 2 (two) times daily. Patient not taking: Reported on 10/06/2019 11/10/16   Beverly Gust, MD    Family History Family History  Problem Relation Age of Onset   Diabetes Maternal Grandmother     Social History Social History   Tobacco Use   Smoking status: Never Smoker   Smokeless tobacco: Never Used  Substance Use Topics   Alcohol use: No    Alcohol/week: 0.0 standard drinks   Drug use: No     Allergies   Patient has no known allergies.   Review of Systems  Review of Systems  Gastrointestinal: Positive for abdominal pain.   All other systems reviewed and are negative except that which was mentioned in HPI   Physical Exam Updated Vital Signs BP 124/66    Pulse (!) 39    Temp 98.3 F (36.8 C) (Oral)    Resp 14    Ht 6' (1.829 m)    Wt 80 kg    SpO2 93%    BMI 23.92 kg/m   Physical Exam Vitals signs and nursing note reviewed.  Constitutional:      General: He is not in acute distress.    Appearance: He is well-developed.  HENT:     Head: Normocephalic and atraumatic.  Eyes:     Comments: B/l conjunctival injection  Neck:       Musculoskeletal: Neck supple.  Cardiovascular:     Rate and Rhythm: Regular rhythm. Bradycardia present.     Heart sounds: Normal heart sounds. No murmur.  Pulmonary:     Effort: Pulmonary effort is normal.     Breath sounds: Normal breath sounds.  Abdominal:     General: Bowel sounds are normal. There is no distension.     Palpations: Abdomen is soft.     Tenderness: There is abdominal tenderness in the right lower quadrant and left upper quadrant. There is no guarding or rebound.     Comments: Generalized TTP but worst in LUQ, RLQ  Skin:    General: Skin is warm and dry.  Neurological:     Mental Status: He is alert and oriented to person, place, and time.     Comments: Fluent speech  Psychiatric:        Mood and Affect: Mood normal.        Judgment: Judgment normal.      ED Treatments / Results  Labs (all labs ordered are listed, but only abnormal results are displayed) Labs Reviewed  COMPREHENSIVE METABOLIC PANEL - Abnormal; Notable for the following components:      Result Value   Glucose, Bld 119 (*)    BUN 22 (*)    Creatinine, Ser 1.52 (*)    All other components within normal limits  LIPASE, BLOOD  CBC  URINALYSIS, ROUTINE W REFLEX MICROSCOPIC    EKG None  Radiology Ct Abdomen Pelvis W Contrast  Result Date: 10/14/2019 CLINICAL DATA:  23 year old male with history of upper abdominal pain extending around the back for the past 2 weeks with nausea. EXAM: CT ABDOMEN AND PELVIS WITH CONTRAST TECHNIQUE: Multidetector CT imaging of the abdomen and pelvis was performed using the standard protocol following bolus administration of intravenous contrast. CONTRAST:  100mL OMNIPAQUE IOHEXOL 300 MG/ML  SOLN COMPARISON:  CT the abdomen and pelvis 11/09/2011. FINDINGS: Lower chest: Unremarkable. Hepatobiliary: No suspicious cystic or solid hepatic lesions. No intra or extrahepatic biliary ductal dilatation. Gallbladder is normal in appearance. Pancreas: No pancreatic  mass. No pancreatic ductal dilatation. No pancreatic or peripancreatic fluid collections or inflammatory changes. Spleen: Unremarkable. Adrenals/Urinary Tract: Bilateral kidneys and bilateral adrenal glands are normal in appearance. No hydroureteronephrosis. Urinary bladder is normal in appearance. Stomach/Bowel: Normal appearance of the stomach. No pathologic dilatation of small bowel or colon. Normal appendix. Vascular/Lymphatic: No significant atherosclerotic disease, aneurysm or dissection noted in the abdominal or pelvic vasculature. No lymphadenopathy noted in the abdomen or pelvis. Reproductive: Prostate gland and seminal vesicles are unremarkable in appearance. Other: No significant volume of ascites.  No pneumoperitoneum. Musculoskeletal: Bilateral pars defects at L5 incidentally noted. There are no  aggressive appearing lytic or blastic lesions noted in the visualized portions of the skeleton. IMPRESSION: 1. No acute findings are noted in the abdomen or pelvis to account for the patient's symptoms. 2. Normal appendix. 3. 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 Electronically Signed   By: Trudie Reed M.D.   On: 10/14/2019 09:35    Procedures Procedures (including critical care time)  Medications Ordered in ED Medications  sodium chloride flush (NS) 0.9 % injection 3 mL (3 mLs Intravenous Given 10/14/19 0835)  lactated ringers bolus 1,000 mL (0 mLs Intravenous Stopped 10/14/19 1006)  ondansetron (ZOFRAN) injection 4 mg (4 mg Intravenous Given 10/14/19 0834)  iohexol (OMNIPAQUE) 300 MG/ML solution 100 mL (100 mLs Intravenous Contrast Given 10/14/19 0928)  famotidine (PEPCID) tablet 40 mg (40 mg Oral Given 10/14/19 1007)     Initial Impression / Assessment and Plan / ED Course  I have reviewed the triage vital signs and the nursing notes.  Pertinent labs & imaging results that were available during my care of the patient were reviewed by me and  considered in my medical decision making (see chart for details).        Patient was nontoxic on exam, bradycardic but he is athletic.  Generalized abdominal tenderness but worst in right lower quadrant and left upper quadrant.  I consider the possibility of kidney stone given the unilateral nature of his pain and radiation to his back, however he has had no urinary symptoms and UA is normal.  His lab work is notable for normalization of his lipase however mild AKI with creatinine of 1.5.  Differential includes ruptured appendicitis, ongoing pancreatitis, gallbladder pathology, enteritis.  Because of the persistent nature of his symptoms without improvement and his tenderness on exam, obtain CT of abdomen pelvis which was unremarkable.  On reassessment after receiving above medications, the patient reported his nausea was improved and he had been able to tolerate water.  Discussed work-up findings and discussed the possibility of gastritis or PUD given the chronicity of his symptoms and normal CT.  Recommended trial of H2 blocker with transition to PPI in 1 to 2 weeks if no relief.  Counseled on low acid diet and good hydration.  Strongly recommended establishing care with PCP for follow-up.  I have extensively reviewed return precautions that he has voiced understanding.  Final Clinical Impressions(s) / ED Diagnoses   Final diagnoses:  Generalized abdominal pain  Nausea    ED Discharge Orders         Ordered    ondansetron (ZOFRAN ODT) 4 MG disintegrating tablet  Every 8 hours PRN     10/14/19 1126    famotidine (PEPCID) 20 MG tablet  2 times daily     10/14/19 1126           Shaniqua Guillot, Ambrose Finland, MD 10/14/19 1139

## 2019-10-14 NOTE — Discharge Instructions (Signed)
Start taking pepcid 1 pill twice daily. Take zofran as needed for nausea. Avoid foods high in acid--tomatoes, citrus fruits/juices, or spicy/greasy foods. If no improvement after taking pepcid daily for 1 week, switch from pepcid to PREVACID, PRILOSEC, or NEXIUM once daily for 14 days.

## 2019-10-14 NOTE — ED Triage Notes (Signed)
Pt c/o R sided abd pain onset 2 weeks ago, constant nausea, pt states he was seen and told to try liquid diet, nausea now worse. Pt states tonight at work, pain was mainly on R side, after lifting object he felt something "let go" then pain radiated to back.  Pt reports pain now coming in waves radiating around abdomen.

## 2019-10-25 ENCOUNTER — Ambulatory Visit (HOSPITAL_COMMUNITY)
Admission: EM | Admit: 2019-10-25 | Discharge: 2019-10-25 | Disposition: A | Discharge status: Home / Self Care | Payer: Managed Care, Other (non HMO) | Attending: Emergency Medicine | Admitting: Emergency Medicine

## 2019-10-25 ENCOUNTER — Other Ambulatory Visit: Payer: Self-pay

## 2019-10-25 ENCOUNTER — Encounter (HOSPITAL_COMMUNITY): Payer: Self-pay | Admitting: Emergency Medicine

## 2019-10-25 DIAGNOSIS — Z20822 Contact with and (suspected) exposure to covid-19: Secondary | ICD-10-CM

## 2019-10-25 DIAGNOSIS — Z20828 Contact with and (suspected) exposure to other viral communicable diseases: Secondary | ICD-10-CM | POA: Diagnosis not present

## 2019-10-25 NOTE — Discharge Instructions (Addendum)

## 2019-10-25 NOTE — ED Triage Notes (Signed)
Patient was at a party 3-4 days ago.  A cousin tested positive.  Patient has no symptoms.  Patients immediate family has tested positive, patient does not live with them.

## 2019-10-25 NOTE — ED Provider Notes (Signed)
[facility]    CSN: [id number] Arrival date & time: 10/25/19  1144      History   Chief Complaint Chief Complaint  Patient presents with  . Labs Only    HPI Vincent Edwards is a 23 y.o. male.   Vincent Edwards [age] y old male presented to the urgent care with a complaint of COVID esposure 3 days ago. Her couisin tested positive for COVID-19 recently.  Denies sick exposure to flu or strep.  Denies recent travel.  Denies aggravating or alleviating symptoms.  Denies previous COVID infection.   Denies fever, chills, fatigue, nasal congestion, rhinorrhea, sore throat, cough, SOB, wheezing, chest pain, nausea, vomiting, changes in bowel or bladder habits.   The history is provided by the patient. No language interpreter was used.    Past Medical History:  Diagnosis Date  . Wears contact lenses     Patient Active Problem List   Diagnosis Date Noted  . Acromioclavicular sprain 09/25/2014  . Enthesopathy of hip 01/07/2013  . Disorder of pelvis 12/03/2012  . Closed avulsion fracture of anterior superior iliac spine of pelvis (HCC) 12/03/2012  . Arthralgia of hip 12/03/2012  . CONCUSSION 09/02/2010  . PES PLANUS 08/17/2010    Past Surgical History:  Procedure Laterality Date  . ABCESS DRAINAGE     nasal  . HIP SURGERY Bilateral 2014   Football injury  . NASAL SEPTOPLASTY W/ TURBINOPLASTY  12/207   chronic congestion - McQueen  . NASAL TURBINATE REDUCTION N/A 11/10/2016   Procedure: TURBINATE REDUCTION/SUBMUCOSAL RESECTION;  Surgeon: Linus Salmonshapman McQueen, MD;  [facility];  Service: ENT;  Laterality: N/A;  . SEPTOPLASTY N/A 11/10/2016   Procedure: SEPTOPLASTY;  Surgeon: Linus Salmonshapman McQueen, MD;  [facility];  Service: ENT;  Laterality: N/A;  . WISDOM TOOTH EXTRACTION         Home Medications    Prior to Admission medications   Medication Sig Start Date End Date Taking? Authorizing Provider  famotidine (PEPCID) 20 MG tablet Take 1 tablet  (20 mg total) by mouth 2 (two) times daily. 10/14/19   Little, Ambrose Finlandachel Morgan, MD  ondansetron (ZOFRAN ODT) 4 MG disintegrating tablet Take 1 tablet (4 mg total) by mouth every 8 (eight) hours as needed for nausea or vomiting. 10/14/19   Little, Ambrose Finlandachel Morgan, MD  oxyCODONE-acetaminophen (ROXICET) 5-325 MG tablet Take 1-2 tablets by mouth every 4 (four) hours as needed for severe pain. Patient not taking: Reported on 10/06/2019 11/10/16   Linus SalmonsMcQueen, Chapman, MD  sulfamethoxazole-trimethoprim (BACTRIM DS,SEPTRA DS) 800-160 MG tablet Take 1 tablet by mouth 2 (two) times daily. Patient not taking: Reported on 10/06/2019 11/10/16   Linus SalmonsMcQueen, Chapman, MD    Family History Family History  Problem Relation Age of Onset  . Diabetes Maternal Grandmother     Social History Social History   Tobacco Use  . Smoking status: Never Smoker  . Smokeless tobacco: Never Used  Substance Use Topics  . Alcohol use: No    Alcohol/week: 0.0 standard drinks  . Drug use: No     Allergies   Patient has no known allergies.   Review of Systems Review of Systems  Constitutional: Negative.   HENT: Negative.   Respiratory: Negative.   Cardiovascular: Negative.   Neurological: Negative.   ROS: All other are negative   Physical Exam Triage Vital Signs ED Triage Vitals  Enc Vitals Group     BP      Pulse      Resp  Temp      Temp src      SpO2      Weight      Height      Head Circumference      Peak Flow      Pain Score      Pain Loc      Pain Edu?      Excl. in GC?    No data found.  Updated Vital Signs There were no vitals taken for this visit.  Visual Acuity Right Eye Distance:   Left Eye Distance:   Bilateral Distance:    Right Eye Near:   Left Eye Near:    Bilateral Near:     Physical Exam Vitals signs and nursing note reviewed.  Constitutional:      General: He is not in acute distress.    Appearance: Normal appearance. He is normal weight. He is not ill-appearing.   HENT:     Head: Normocephalic.     Right Ear: Tympanic membrane, ear canal and external ear normal. There is no impacted cerumen.     Left Ear: Tympanic membrane, ear canal and external ear normal. There is no impacted cerumen.     Nose: Nose normal. No congestion.  Cardiovascular:     Rate and Rhythm: Regular rhythm.     Pulses: Normal pulses.     Heart sounds: Normal heart sounds.  Pulmonary:     Effort: Pulmonary effort is normal. No respiratory distress.     Breath sounds: Normal breath sounds.  Chest:     Chest wall: No tenderness.  Abdominal:     General: Abdomen is flat. Bowel sounds are normal.     Palpations: Abdomen is soft.  Neurological:     Mental Status: He is alert.      UC Treatments / Results  Labs (all labs ordered are listed, but only abnormal results are displayed) Labs Reviewed  NOVEL CORONAVIRUS, NAA (HOSP ORDER, SEND-OUT TO REF LAB; TAT 18-24 HRS)    EKG   Radiology No results found.  Procedures Procedures (including critical care time)  Medications Ordered in UC Medications - No data to display  Initial Impression / Assessment and Plan / UC Course  I have reviewed the triage vital signs and the nursing notes.  Pertinent labs & imaging results that were available during my care of the patient were reviewed by me and considered in my medical decision making (see chart for details).    Patient stable for discharge.  Benign physical exam.  Covid test ordered. Will call if results positive.  To return if symptoms get worse. Final Clinical Impressions(s) / UC Diagnoses   Final diagnoses:  Exposure to COVID-19 virus     Discharge Instructions     COVID testing ordered.  It will take between 2-7 days for test results.  Someone will contact you regarding abnormal results.    In the meantime: You should remain isolated in your home for 10 days from symptom onset AND greater than 72 hours after symptoms resolution (absence of fever without  the use of fever-reducing medication and improvement in respiratory symptoms), whichever is longer Get plenty of rest and push fluids Use medications daily for symptom relief Use OTC medications like ibuprofen or tylenol as needed fever or pain Call or go to the ED if you have any new or worsening symptoms such as fever, worsening cough, shortness of breath, chest tightness, chest pain, turning blue, changes in mental status, etc..Marland Kitchen  ED Prescriptions    None     PDMP not reviewed this encounter.   Emerson Monte, Tarnov 10/25/19 [phone]

## 2019-10-27 LAB — NOVEL CORONAVIRUS, NAA (HOSP ORDER, SEND-OUT TO REF LAB; TAT 18-24 HRS): SARS-CoV-2, NAA: NOT DETECTED

## 2020-02-11 ENCOUNTER — Ambulatory Visit (INDEPENDENT_AMBULATORY_CARE_PROVIDER_SITE_OTHER): Payer: Managed Care, Other (non HMO) | Admitting: Primary Care

## 2020-02-11 ENCOUNTER — Other Ambulatory Visit: Payer: Self-pay

## 2020-02-11 ENCOUNTER — Encounter: Payer: Self-pay | Admitting: Primary Care

## 2020-02-11 DIAGNOSIS — R1084 Generalized abdominal pain: Secondary | ICD-10-CM | POA: Diagnosis not present

## 2020-02-11 DIAGNOSIS — R109 Unspecified abdominal pain: Secondary | ICD-10-CM | POA: Insufficient documentation

## 2020-02-11 DIAGNOSIS — K219 Gastro-esophageal reflux disease without esophagitis: Secondary | ICD-10-CM | POA: Insufficient documentation

## 2020-02-11 LAB — COMPREHENSIVE METABOLIC PANEL
ALT: 24 U/L (ref 0–53)
AST: 22 U/L (ref 0–37)
Albumin: 4.6 g/dL (ref 3.5–5.2)
Alkaline Phosphatase: 55 U/L (ref 39–117)
BUN: 18 mg/dL (ref 6–23)
CO2: 30 mEq/L (ref 19–32)
Calcium: 9.6 mg/dL (ref 8.4–10.5)
Chloride: 102 mEq/L (ref 96–112)
Creatinine, Ser: 1.28 mg/dL (ref 0.40–1.50)
GFR: 84.16 mL/min (ref >60.00)
Glucose, Bld: 93 mg/dL (ref 70–99)
Potassium: 4.6 mEq/L (ref 3.5–5.1)
Sodium: 137 mEq/L (ref 135–145)
Total Bilirubin: 0.8 mg/dL (ref 0.2–1.2)
Total Protein: 7.7 g/dL (ref 6.0–8.3)

## 2020-02-11 LAB — CBC
HCT: 46.4 % (ref 39.0–52.0)
Hemoglobin: 15.3 g/dL (ref 13.0–17.0)
MCHC: 32.9 g/dL (ref 30.0–36.0)
MCV: 93.2 fl (ref 78.0–100.0)
Platelets: 194 10*3/uL (ref 150.0–400.0)
RBC: 4.98 Mil/uL (ref 4.22–5.81)
RDW: 12.4 % (ref 11.5–15.5)
WBC: 3.9 10*3/uL — ABNORMAL LOW (ref 4.0–10.5)

## 2020-02-11 LAB — LIPASE: Lipase: 8 U/L — ABNORMAL LOW (ref 11.0–59.0)

## 2020-02-11 MED ORDER — OMEPRAZOLE 20 MG PO CPDR: 20.0000 mg | DELAYED_RELEASE_CAPSULE | Freq: Every day | ORAL | 0 refills | Status: AC

## 2020-02-11 NOTE — Patient Instructions (Addendum)
Stop by the lab prior to leaving today. I will notify you of your results once received.   Stop famotidine.  Start omeprazole 20 mg once daily for heartburn. Take this everyday for one month.  Please update me via my chart in 2 weeks.  It was a pleasure to meet you today! Please don't hesitate to call or message me with any questions. Welcome to [facility]!

## 2020-02-11 NOTE — Progress Notes (Signed)
Subjective:    Patient ID: Vincent Edwards, male    DOB: 1996/01/23, 24 y.o.   MRN: [id number]  HPI  This visit occurred during the SARS-CoV-2 public health emergency.  Safety protocols were in place, including screening questions prior to the visit, additional usage of staff PPE, and extensive cleaning of exam room while observing appropriate contact time as indicated for disinfecting solutions.   Vincent Edwards is a 24 year old male who presents today to establish care and discuss the problems mentioned below. Will obtain/review records.  1) Abdominal Pain:   Evaluated at [facility] on 10/04/20 for complaints of one week history of mid and upper abdominal pain, worse with eating "thick bread". Labs grossly unremarkable including UA, Lipase of 135. He was discharged home later that day with instructions for abstaining from alcohol, clear liquid diet, Covid-19 test.  He returned to [facility] on 10/13/20 with complaints of RLQ abdominal pain with radiation to his back. Also with nausea without vomiting. Tender on exam to lower abdomen so CT of abdomen/pelvis was obtained. CT negative so it was recommended he try H2 blocker then PPI if no improvement.   Today he endorses chronic abdominal pain that begins to bilateral lower abdomen sides with radiation to bilateral upper abdomen sides and meet to the epigastric region. Symptoms began about one year ago, intermittent occurring 2-3 times weekly. Foods that he's noticed causing this pain include, grilled cheese sandwich, apple, chips.  He does feel an acidic feeling in the chest despite food choices. Most everything causes his burning with nausea in the chest. He's taken famotidine daily for a period of time which helped reduce nausea. He's now taking famotidine 2-3 times weekly with little improvement.   He denies diarrhea. He is having bowel movements daily but they are firm and he has to strain. He is drinking mostly water, endorses a healthy diet, exercises  regularly.   He works at [employer], lifts heavy boxes often. Last November he was walking in the freezer at work carrying boxes, had a "stabbing" sudden onset of epigastric pain. This prompted his ED visit.    BP Readings from Last 3 Encounters:  02/11/20 118/88  10/25/19 134/83  10/14/19 124/85     Review of Systems  Constitutional: Negative for fever.  Respiratory: Negative for shortness of breath.   Cardiovascular: Negative for chest pain.  Gastrointestinal: Positive for abdominal pain and nausea. Negative for constipation, diarrhea and vomiting.       Esophageal burning, firm stools requiring to strain.       Past Medical History:  Diagnosis Date  . Wears contact lenses      Social History   Socioeconomic History  . Marital status: Single    Spouse name: Not on file  . Number of children: Not on file  . Years of education: Not on file  . Highest education level: Not on file  Occupational History  . Not on file  Tobacco Use  . Smoking status: Never Smoker  . Smokeless tobacco: Never Used  Substance and Sexual Activity  . Alcohol use: No    Alcohol/week: 0.0 standard drinks  . Drug use: No  . Sexual activity: Not on file  Other Topics Concern  . Not on file  Social History Narrative  . Not on file   Social Determinants of Health   Financial Resource Strain:   . Difficulty of Paying Living Expenses:   Food Insecurity:   . Worried About Charity fundraiser  in the Last Year:   . Ran Out of Food in the Last Year:   Transportation Needs:   . Freight forwarder (Medical):   Marland Kitchen Lack of Transportation (Non-Medical):   Physical Activity:   . Days of Exercise per Week:   . Minutes of Exercise per Session:   Stress:   . Feeling of Stress :   Social Connections:   . Frequency of Communication with Friends and Family:   . Frequency of Social Gatherings with Friends and Family:   . Attends Religious Services:   . Active Member of Clubs or Organizations:     . Attends Banker Meetings:   Marland Kitchen Marital Status:   Intimate Partner Violence:   . Fear of Current or Ex-Partner:   . Emotionally Abused:   Marland Kitchen Physically Abused:   . Sexually Abused:     Past Surgical History:  Procedure Laterality Date  . ABCESS DRAINAGE     nasal  . HIP SURGERY Bilateral 2014   Football injury  . NASAL SEPTOPLASTY W/ TURBINOPLASTY  12/207   chronic congestion - McQueen  . NASAL TURBINATE REDUCTION N/A 11/10/2016   Procedure: TURBINATE REDUCTION/SUBMUCOSAL RESECTION;  Surgeon: Linus Salmons, MD;  Location: [facility];  Service: ENT;  Laterality: N/A;  . SEPTOPLASTY N/A 11/10/2016   Procedure: SEPTOPLASTY;  Surgeon: Linus Salmons, MD;  [facility];  Service: ENT;  Laterality: N/A;  . WISDOM TOOTH EXTRACTION      Family History  Problem Relation Age of Onset  . Diabetes Mother   . Diabetes Maternal Grandmother   . Asthma Brother     No Known Allergies  Current Outpatient Medications on File Prior to Visit  Medication Sig Dispense Refill  . famotidine (PEPCID) 20 MG tablet Take 1 tablet (20 mg total) by mouth 2 (two) times daily. 30 tablet 0   No current facility-administered medications on file prior to visit.    BP 118/88   Pulse 71   Temp 98.1 F (36.7 C)   Resp 20   Ht 5' 11.5" (1.816 m)   Wt 196 lb 8 oz (89.1 kg)   SpO2 97%   BMI 27.02 kg/m    Objective:   Physical Exam  Constitutional: He appears well-nourished.  Cardiovascular: Normal rate and regular rhythm.  Respiratory: Effort normal and breath sounds normal.  GI: Soft. Bowel sounds are normal. There is no abdominal tenderness.  Musculoskeletal:     Cervical back: Neck supple.  Skin: Skin is warm and dry.  Psychiatric: He has a normal mood and affect.           Assessment & Plan:

## 2020-02-11 NOTE — Assessment & Plan Note (Addendum)
Chronic to bilateral sides of upper and lower abdomen and epigastric region. No tenderness on exam today. ED visits including labs/imaging reviewed.  Consider IBS. He does strain stools so he could have some element of constipation. Consider inflammatory bowel disorder. Repeat lipase, CMP, CBC to start.  Discussed use of stool softener daily. Consider GI evaluation.

## 2020-02-11 NOTE — Assessment & Plan Note (Signed)
Seems to be occurring most everyday, some improvement with famotidine but no complete relief.   Rx for omeprazole 20 mg sent to pharmacy. Discussed for him to update me in 2 weeks via my chart.
